# Patient Record
Sex: Male | Born: 1950 | Race: White | Hispanic: No | Marital: Married
Health system: Southern US, Community
[De-identification: ages and names within clinical notes are randomized; demographics above are authoritative.]

---

## 2003-12-14 ENCOUNTER — Ambulatory Visit: Payer: Self-pay | Admitting: Family Medicine

## 2004-08-22 ENCOUNTER — Ambulatory Visit: Payer: Self-pay | Admitting: Family Medicine

## 2004-09-10 ENCOUNTER — Ambulatory Visit: Payer: Self-pay | Admitting: Family Medicine

## 2004-10-11 ENCOUNTER — Ambulatory Visit (HOSPITAL_COMMUNITY): Admission: RE | Admit: 2004-10-11 | Discharge: 2004-10-11 | Payer: Self-pay | Admitting: Vascular Surgery

## 2005-01-01 ENCOUNTER — Ambulatory Visit: Payer: Self-pay | Admitting: Family Medicine

## 2005-07-24 ENCOUNTER — Ambulatory Visit (HOSPITAL_COMMUNITY): Admission: RE | Admit: 2005-07-24 | Discharge: 2005-07-24 | Payer: Self-pay | Admitting: Gastroenterology

## 2005-07-29 ENCOUNTER — Ambulatory Visit: Payer: Self-pay | Admitting: Gastroenterology

## 2006-12-15 IMAGING — PT NM PET TUM IMG SKULL BASE T - THIGH
4 series · 25 of 25 positions shown · non-contrast
Comparison: None.

CLINICAL DATA: Malignant melanoma right back with possible metastasis to the left side of the back.  
FDG PET-CT TUMOR IMAGING (SKULL BASE TO FEET):
Fasting Blood Glucose:  90.
TECHNIQUE: 16.4 mCi F-18 FDG were administered via the right antecubital fossa.  Full ring PET imaging was performed from the skull base through the mid-thighs 50 minutes after injection.  CT data was obtained and used for attenuation correction and anatomic localization only.  (This was not acquired as a diagnostic CT examination.)

[Series 1: pet ac · axial · 3.3mm · 4.69mm/px · z∈[-870,+0]mm · 8 of 267 slices shown]
[im 1/267]
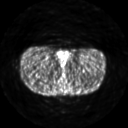
[im 39/267]
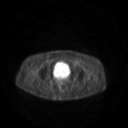
[im 77/267]
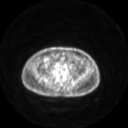
[im 115/267]
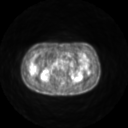
[im 153/267]
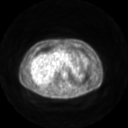
[im 191/267]
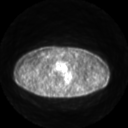
[im 229/267]
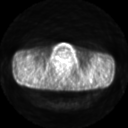
[im 267/267]
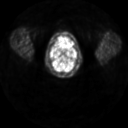

[Series 2: ct images · axial · 3.8mm · 0.98mm/px · z∈[-870,+0]mm · 8 of 267 slices shown]
[im 1/267]
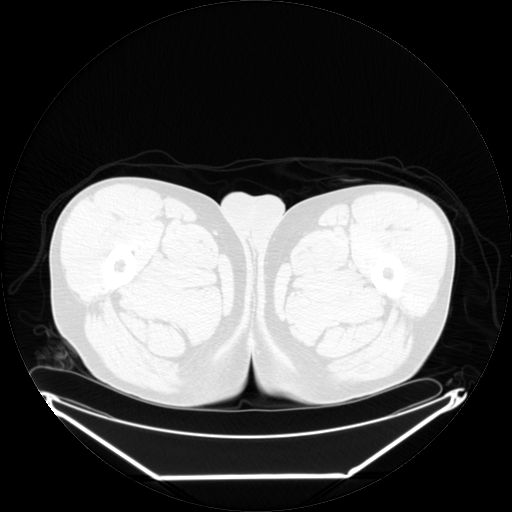
[im 39/267]
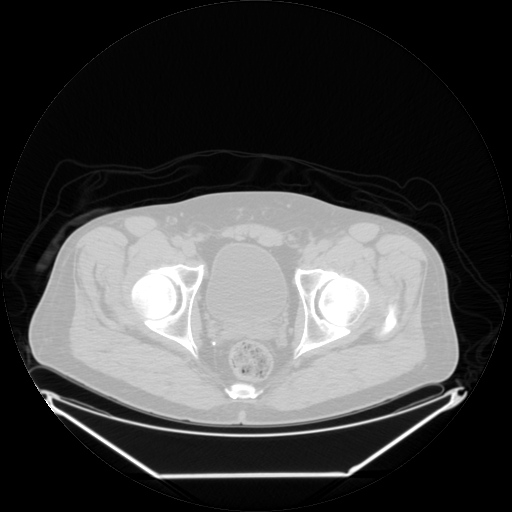
[im 77/267]
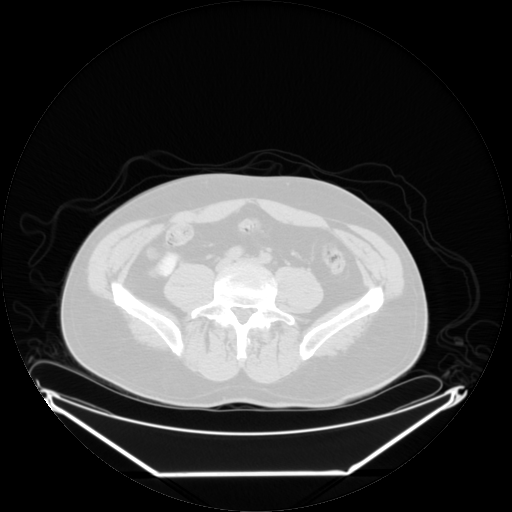
[im 115/267]
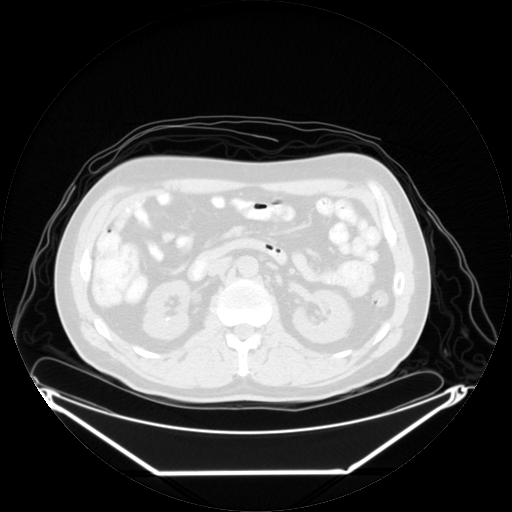
[im 153/267]
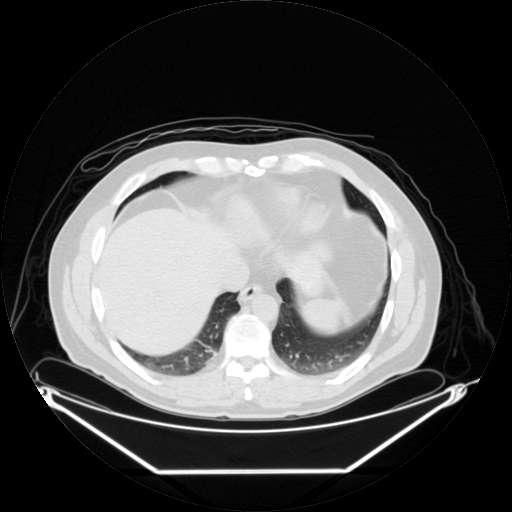
[im 191/267]
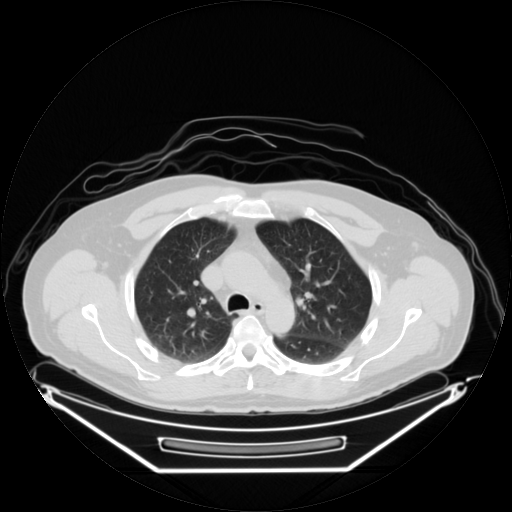
[im 229/267]
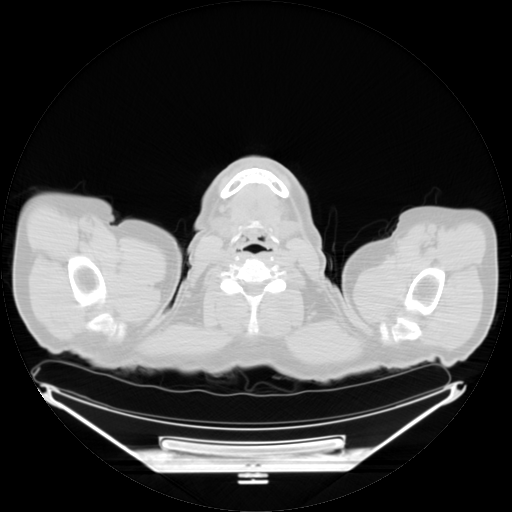
[im 267/267  brain]
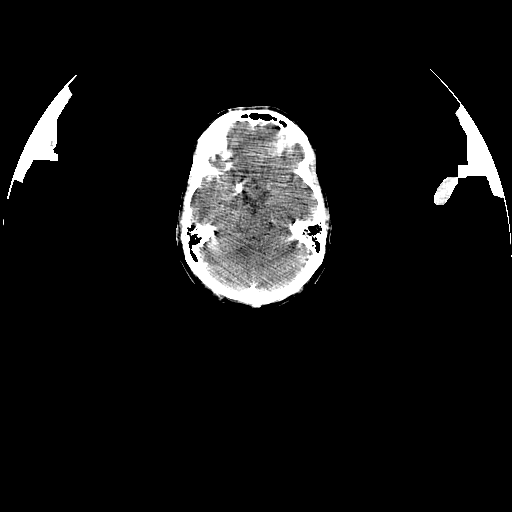

[Series 2: pet nac · axial · 3.3mm · 4.69mm/px · z∈[-870,+0]mm · 8 of 267 slices shown]
[im 1/267]
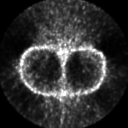
[im 39/267]
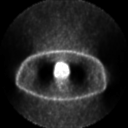
[im 77/267]
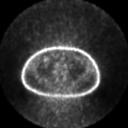
[im 115/267]
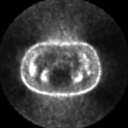
[im 153/267]
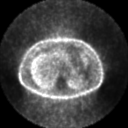
[im 191/267]
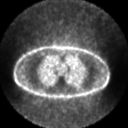
[im 229/267]
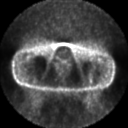
[im 267/267]
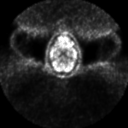

[Series 123: mip · coronal · 3.3mm · 4.69mm/px · 1 of 30 slices shown]
[im 1/30]
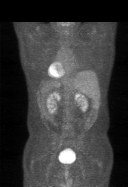

[25 of 25 positions shown; findings below may reference images not displayed]

FINDINGS: There is a single mildly hypermetabolic lymph node in the upper left neck, deep to the sternocleidomastoid muscle at the C3-4 disc space level.  This is seen on CT image #29 where it has a short axis dimension of 7 mm.  This node has a maximal SUV of 3.2 g/mL.  No other nodal activity is seen within the neck, chest, abdomen, pelvis, or lower extremities.  I do not see any dermal or subcutaneous lesions in the patient?s back or elsewhere to suggest metastatic disease.  The CT images demonstrate a cyst in the left hepatic lobe and numerous renal calyceal calculi bilaterally without hydronephrosis.  There is no adrenal mass or suspicious metabolic activity within the liver.
IMPRESSION: 1.  There is a single mildly hypermetabolic lymph node in the posterior triangle of the left neck as described which is suspicious for metastatic melanoma, given the patient?s history.  No other metastases are identified.
2.  Incidentally noted are numerous non-obstructing calyceal renal calculi bilaterally.

## 2018-11-23 ENCOUNTER — Other Ambulatory Visit: Payer: Self-pay | Admitting: Radiation Oncology

## 2018-11-23 ENCOUNTER — Ambulatory Visit
Admission: RE | Admit: 2018-11-23 | Discharge: 2018-11-23 | Disposition: A | Discharge status: Home / Self Care | Payer: Self-pay | Source: Ambulatory Visit | Attending: Radiation Oncology | Admitting: Radiation Oncology

## 2018-11-23 ENCOUNTER — Telehealth: Payer: Self-pay | Admitting: *Deleted

## 2018-11-23 DIAGNOSIS — C61 Malignant neoplasm of prostate: Secondary | ICD-10-CM

## 2018-11-23 NOTE — Telephone Encounter (Signed)
Made several attempts to reach out to the patient by leaving voice mail. Attempted to call work number but it is no longer in service.

## 2018-11-29 ENCOUNTER — Telehealth: Payer: Self-pay | Admitting: *Deleted

## 2018-11-29 NOTE — Telephone Encounter (Signed)
Patient;s wife called to cancel the consultation appointment for 11/29/18. She said that they have a consultation at [facility] and wants to be treated there.

## 2018-11-30 ENCOUNTER — Ambulatory Visit: Payer: Self-pay | Admitting: Radiation Oncology

## 2018-11-30 ENCOUNTER — Telehealth: Payer: Self-pay
